# Patient Record
Sex: Male | Born: 1995 | Hispanic: Yes | Marital: Single | Smoking: Never smoker
Health system: Southern US, Community
[De-identification: ages and names within clinical notes are randomized; demographics above are authoritative.]

## PROBLEM LIST (undated history)

## (undated) HISTORY — PX: NO PAST SURGERIES: SHX2092

---
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
## 2019-03-19 ENCOUNTER — Other Ambulatory Visit: Payer: Self-pay

## 2019-03-19 ENCOUNTER — Ambulatory Visit
Admission: EM | Admit: 2019-03-19 | Discharge: 2019-03-19 | Disposition: A | Discharge status: Home / Self Care | Payer: Self-pay | Attending: Family Medicine | Admitting: Family Medicine

## 2019-03-19 DIAGNOSIS — H6692 Otitis media, unspecified, left ear: Secondary | ICD-10-CM

## 2019-03-19 MED ORDER — AMOXICILLIN 875 MG PO TABS: 875.0000 mg | ORAL_TABLET | Freq: Two times a day (BID) | ORAL | 0 refills | Status: DC

## 2019-03-19 NOTE — ED Triage Notes (Signed)
Patient complains of left ear pain that started 2 weeks ago. Patient states that he has been using OTC drops without relief. Patient states that he is concerned its now infected, reports that he needs a note for work.

## 2019-03-19 NOTE — ED Provider Notes (Signed)
[facility] ____________________________________________  Time seen: Approximately 11:36 AM  I have reviewed the triage vital signs and the nursing notes.   HISTORY  Chief Complaint Otalgia (left)   HPI Phillip Wang is a 23 y.o. male presenting for evaluation of left ear pain.  Patient reports of the last 2 weeks he has had gradual onset of left ear discomfort.  Has used over-the-counter eardrops which would relieve discomfort for approximately 30 minutes, but reports the pain has continued prompting him to come in.  Denies hearing changes, drainage, injury or other complaints.  No recent cough, congestion, sore throat or fevers.  Reports otherwise doing well.  Denies other aggravating alleviating factors.   History reviewed. No pertinent past medical history.  There are no active problems to display for this patient.   Past Surgical History:  Procedure Laterality Date  . NO PAST SURGERIES       No current facility-administered medications for this encounter.   Current Outpatient Medications:  .  amoxicillin (AMOXIL) 875 MG tablet, Take 1 tablet (875 mg total) by mouth 2 (two) times daily., Disp: 20 tablet, Rfl: 0  Allergies Patient has no known allergies.  No family history on file.  Social History Social History   Tobacco Use  . Smoking status: Never Smoker  . Smokeless tobacco: Never Used  Substance Use Topics  . Alcohol use: Yes    Comment: occasionally  . Drug use: Never    Review of Systems Constitutional: No fever ENT: No sore throat.  Positive ear pain. Cardiovascular: Denies chest pain. Respiratory: Denies shortness of breath. Gastrointestinal: No abdominal pain.   Skin: Negative for rash. Neurological: Negative for focal weakness or numbness.   ____________________________________________   PHYSICAL EXAM:  VITAL SIGNS: ED Triage Vitals  Enc Vitals Group     BP 03/19/19 1110 113/71     Pulse Rate 03/19/19 1110 (!) 59    Resp 03/19/19 1110 15     Temp 03/19/19 1110 98.4 F (36.9 C)     Temp Source 03/19/19 1110 Oral     SpO2 03/19/19 1110 100 %     Weight 03/19/19 1108 210 lb (95.3 kg)     Height 03/19/19 1108 5\' 8"  (1.727 m)     Head Circumference --      Peak Flow --      Pain Score 03/19/19 1108 8     Pain Loc --      Pain Edu? --      Excl. in GC? --    Constitutional: Alert and oriented. Well appearing and in no acute distress. Eyes: Conjunctivae are normal.  Head: Atraumatic. No sinus tenderness to palpation. No swelling. No erythema.  Ears: No mastoid tenderness bilaterally.  Left: Nontender, normal canal, moderate erythema and dull TM.  Right: Nontender, normal canal, no erythema, normal TM.  Nose:No nasal congestion. Neck: No stridor.  No cervical spine tenderness to palpation. Hematological/Lymphatic/Immunilogical: Mild left anterior auricular lymphadenopathy, no cervical lymphadenopathy. Cardiovascular: Normal rate, regular rhythm. Grossly normal heart sounds. Good peripheral circulation. Respiratory: Normal respiratory effort.  No retractions. No wheezes, rales or rhonchi. Good air movement.  Musculoskeletal: Ambulatory with steady gait.  Neurologic:  Normal speech and language. No gait instability. Skin:  Skin appears warm, dry and intact. No rash noted. Psychiatric: Mood and affect are normal. Speech and behavior are normal. ___________________________________________   LABS (all labs ordered are listed, but only abnormal results are displayed)  Labs Reviewed - No data to display ____________________________________________  PROCEDURES Procedures   INITIAL IMPRESSION / ASSESSMENT AND PLAN / ED COURSE  Pertinent labs & imaging results that were available during my care of the patient were reviewed by me and considered in my medical decision making (see chart for details).  Well-appearing patient.  No acute distress.  Left otalgia.  Left otitis media.  Will treat with  amoxicillin.  Over-the-counter ibuprofen, monitor.  Supportive care.  Work note given for today.Discussed indication, risks and benefits of medications with patient.  Discussed follow up and return parameters including no resolution or any worsening concerns. Patient verbalized understanding and agreed to plan.   ____________________________________________   FINAL CLINICAL IMPRESSION(S) / ED DIAGNOSES  Final diagnoses:  Left otitis media, unspecified otitis media type     ED Discharge Orders         Ordered    amoxicillin (AMOXIL) 875 MG tablet  2 times daily     03/19/19 1135           Note: This dictation was prepared with Dragon dictation along with smaller phrase technology. Any transcriptional errors that result from this process are unintentional.         Marylene Land, NP 03/19/19 1157

## 2019-03-19 NOTE — Discharge Instructions (Addendum)
Take medication as prescribed. Rest. Drink plenty of fluids.  Over-the-counter ibuprofen as needed.  Follow up with your primary care physician this week as needed. Return to Urgent care for new or worsening concerns.

## 2019-03-30 ENCOUNTER — Encounter: Payer: Self-pay | Admitting: Emergency Medicine

## 2019-03-30 ENCOUNTER — Ambulatory Visit
Admission: EM | Admit: 2019-03-30 | Discharge: 2019-03-30 | Disposition: A | Discharge status: Home / Self Care | Payer: Self-pay

## 2019-03-30 ENCOUNTER — Other Ambulatory Visit: Payer: Self-pay

## 2019-03-30 DIAGNOSIS — J329 Chronic sinusitis, unspecified: Secondary | ICD-10-CM

## 2019-03-30 DIAGNOSIS — J310 Chronic rhinitis: Secondary | ICD-10-CM

## 2019-03-30 MED ORDER — AZITHROMYCIN 250 MG PO TABS: ORAL_TABLET | ORAL | 0 refills | Status: AC

## 2019-03-30 NOTE — ED Provider Notes (Signed)
[place], [place]   Name: Phillip Wang DOB: [birth year] MRN: [id number] CSN: [id number] PCP: Patient, No Pcp Per  Arrival date and time:  03/30/19 3818  Chief Complaint:  Otalgia   NOTE: Prior to seeing the patient today, I have reviewed the triage nursing documentation and vital signs. Clinical staff has updated patient's PMH/PSHx, current medication list, and drug allergies/intolerances to ensure comprehensive history available to assist in medical decision making.   History:   HPI: Phillip Wang is a 23 y.o. male who presents today with complaints of pain in his LEFT ear. Patient was seen here on 03/19/2019 and diagnosed with otitis media. He was treated with a  10 day course of amoxicillin. Patient has completed the prescribed course of antibiotics, however he notes that this pain has not improved. Since completing the amoxicillin, patient notes that has has been experiencing generalized headaches and pain "around and behind" his LEFT eye. Patient denies fevers. He has had come mild congestion with no associated cough or shortness of breath. Patient denies otorrhea and sore throat. Has has not been forcefully blowing his nose. He denies changes to his ability to hear. In efforts to conservatively manage his symptoms at home, the patient notes that he has used APAP, which has helped to improve his symptoms to some degree.   History reviewed. No pertinent past medical history.  Past Surgical History:  Procedure Laterality Date  . NO PAST SURGERIES      History reviewed. No pertinent family history.  Social History   Tobacco Use  . Smoking status: Never Smoker  . Smokeless tobacco: Never Used  Substance Use Topics  . Alcohol use: Yes    Comment: occasionally  . Drug use: Never    There are no active problems to display for this patient.   Home Medications:    Current Meds  Medication Sig  . ibuprofen (ADVIL) 200 MG tablet Take 200 mg by mouth every 6 (six) hours as  needed.    Allergies:   Patient has no known allergies.  Review of Systems (ROS): Review of Systems  Constitutional: Negative for chills and fever.  HENT: Positive for congestion, ear pain, sinus pressure and sinus pain. Negative for ear discharge, tinnitus and trouble swallowing.   Eyes: Positive for pain. Negative for photophobia, discharge, redness, itching and visual disturbance.  Respiratory: Negative for cough and shortness of breath.   Cardiovascular: Negative for chest pain and palpitations.  Gastrointestinal: Negative for diarrhea, nausea and vomiting.  Musculoskeletal: Negative for back pain and myalgias.  Skin: Negative for color change, pallor and rash.  Neurological: Positive for headaches. Negative for dizziness, syncope, weakness and numbness.  All other systems reviewed and are negative.    Vital Signs: Today's Vitals   03/30/19 0954 03/30/19 0956 03/30/19 1029  BP:  118/75   Pulse:  70   Resp:  18   Temp:  98.2 F (36.8 C)   TempSrc:  Oral   SpO2:  100%   Weight: 210 lb (95.3 kg)    Height: 5\' 9"  (1.753 m)    PainSc: 8   8     Physical Exam: Physical Exam  Constitutional: He is oriented to person, place, and time and well-developed, well-nourished, and in no distress.  HENT:  Head: Normocephalic and atraumatic.  Right Ear: Hearing and tympanic membrane normal.  Left Ear: Hearing normal. No swelling. Tympanic membrane is injected (cloudy and injected;  not markedly erythematous or bulging). No decreased hearing is noted.  Nose: Mucosal edema present. No rhinorrhea. Left sinus exhibits frontal sinus tenderness.  Mouth/Throat: Uvula is midline and mucous membranes are normal. Posterior oropharyngeal erythema present. No posterior oropharyngeal edema.     Eyes: Pupils are equal, round, and reactive to light. EOM are normal.  Neck: Normal range of motion. Neck supple. No tracheal deviation present.  Cardiovascular: Normal rate, regular rhythm, normal heart  sounds and intact distal pulses. Exam reveals no gallop and no friction rub.  No murmur heard. Pulmonary/Chest: Effort normal and breath sounds normal. No respiratory distress. He has no wheezes. He has no rales.  Lymphadenopathy:       Head (left side): Submandibular adenopathy present.  Neurological: He is alert and oriented to person, place, and time. Gait normal.  Skin: Skin is warm and dry. No rash noted.  Psychiatric: Mood, memory, affect and judgment normal.  Nursing note and vitals reviewed.   Urgent Care Treatments / Results:   LABS: PLEASE NOTE: all labs that were ordered this encounter are listed, however only abnormal results are displayed. Labs Reviewed - No data to display  EKG: -None  RADIOLOGY: No results found.  PROCEDURES: Procedures  MEDICATIONS RECEIVED THIS VISIT: Medications - No data to display  PERTINENT CLINICAL COURSE NOTES/UPDATES:   Initial Impression / Assessment and Plan / Urgent Care Course:  Pertinent labs & imaging results that were available during my care of the patient were personally reviewed by me and considered in my medical decision making (see lab/imaging section of note for values and interpretations).  Terrill Mohrnrique J Sanches is a 23 y.o. male who [facility] with complaints of Otalgia   Patient is well appearing overall in clinic today. He does not appear to be in any acute distress. Presenting symptoms (see HPI) and exam as documented above. Patient has failed to improve with treatment using  amoxicillin. His symptoms have progressed and he is now having frontal sinus tenderness, generalized headaches, some congestion, and the continue pain in his LEFT ear. Suspect sinusitis.mBased on assessment notes from previous visit, his LEFT ear seems to have improved. In efforts to avoid another 10 day course of amoxicillin based therapy (Augmentin), will change to a macrolide antibiotic and treat with a 5 day course of  azithromycin. Discussed supportive care measures at home during acute phase of illness. Patient to rest as much as possible. He was encouraged to ensure adequate hydration (water and ORS) to prevent dehydration and electrolyte derangements. Patient may use APAP and/or IBU on an as needed basis for pain/fever. May use OTC decongestants as needed.   Discussed follow up with primary care physician in 1 week for re-evaluation. I have reviewed the follow up and strict return precautions for any new or worsening symptoms. Patient is aware of symptoms that would be deemed urgent/emergent, and would thus require further evaluation either here or in the emergency department. At the time of discharge, he verbalized understanding and consent with the discharge plan as it was reviewed with him. All questions were fielded by provider and/or clinic staff prior to patient discharge.    Final Clinical Impressions / Urgent Care Diagnoses:   Final diagnoses:  Rhinosinusitis    New Prescriptions:  [place] Controlled Substance Registry consulted? Not Applicable  Meds ordered this encounter  Medications  . azithromycin (ZITHROMAX Z-PAK) 250 MG tablet    Sig: Take 2 tablets (500 mg) for 1 day, then 1 tablet (250 mg) daily x 4 days.    Dispense:  6  each    Refill:  0    Recommended Follow up Care:  Patient encouraged to follow up with the following provider within the specified time frame, or sooner as dictated by the severity of his symptoms. As always, he was instructed that for any urgent/emergent care needs, he should seek care either here or in the emergency department for more immediate evaluation.  Follow-up Information    PCP In 1 week.   Why: General reassessment of symptoms if not improving        NOTE: This note was prepared using Scientist, clinical (histocompatibility and immunogenetics) along with smaller Lobbyist. Despite my best ability to proofread, there is the potential that transcriptional errors may still occur from  this process, and are completely unintentional.    Verlee Monte, NP 03/31/19 1643

## 2019-03-30 NOTE — ED Triage Notes (Signed)
Patient here today returning back from visit a wk ago. Stated that left ear pain has not gotten any better. Completed antibiotic and pain has not subsided.

## 2019-03-30 NOTE — Discharge Instructions (Addendum)
It was very nice seeing you today in clinic. Thank you for entrusting me with your care.   Rest and increase fluid intake. May use Tylenol and/or Ibuprofen as needed for pain/fever.  Please utilize the medications that we discussed. Your prescriptions have been called in to your pharmacy.   Make arrangements to follow up with your regular doctor in 1 week for re-evaluation if not improving. If your symptoms/condition worsens, please seek follow up care either here or in the ER. Please remember, our [facility] providers are "right here with you" when you need [place].   Again, it was my pleasure to take care of you today. Thank you for choosing our clinic. I hope that you start to feel better quickly.   Honor Loh, MSN, APRN, FNP-C, CEN Advanced Practice Provider [facility]

## 2019-04-02 ENCOUNTER — Emergency Department
Admission: EM | Admit: 2019-04-02 | Discharge: 2019-04-02 | Disposition: A | Discharge status: Home / Self Care | Payer: Self-pay | Attending: Student | Admitting: Student

## 2019-04-02 ENCOUNTER — Other Ambulatory Visit: Payer: Self-pay

## 2019-04-02 DIAGNOSIS — H6982 Other specified disorders of Eustachian tube, left ear: Secondary | ICD-10-CM | POA: Insufficient documentation

## 2019-04-02 MED ORDER — CETIRIZINE HCL 10 MG PO TABS: 10.0000 mg | ORAL_TABLET | Freq: Every day | ORAL | 0 refills | Status: AC

## 2019-04-02 MED ORDER — PREDNISONE 50 MG PO TABS: 50.0000 mg | ORAL_TABLET | Freq: Every day | ORAL | 0 refills | Status: AC

## 2019-04-02 MED ORDER — FLUTICASONE PROPIONATE 50 MCG/ACT NA SUSP: 1.0000 | Freq: Two times a day (BID) | NASAL | 0 refills | Status: AC

## 2019-04-02 NOTE — ED Triage Notes (Signed)
Pt reports left ear pain intermittently since September despite taking azithromycin and augmentin. No drainage from ear or fever.

## 2019-04-02 NOTE — ED Provider Notes (Signed)
[facility] Emergency Department Provider Note  ____________________________________________  Time seen: Approximately 8:28 PM  I have reviewed the triage vital signs and the nursing notes.   HISTORY  Chief Complaint Otalgia    HPI Phillip Wang is a 23 y.o. male who presents emergency department complaining of left ear pain.  Patient has had ongoing pain to the left ear times several weeks.  He has been seen twice at urgent care, first prescribed amoxicillin, then azithromycin for his left ear.  Patient was originally diagnosed with otitis media but states that the original antibiotic did not improve symptoms, nor did the second.  Patient reports at no time did the pain relief with antibiotic use.  He has had no fevers or chills, nasal congestion, sore throat or coughing.  No drainage from the left ear.  When asked about allergies patient denies any allergies but states that he typically has sinus pressure, ear pain with change of seasons.  Patient reports this typically occurs during the season.  No other medications other than amoxicillin and azithromycin have been attempted for symptom relief.         History reviewed. No pertinent past medical history.  There are no active problems to display for this patient.   Past Surgical History:  Procedure Laterality Date  . NO PAST SURGERIES      Prior to Admission medications   Medication Sig Start Date End Date Taking? Authorizing Provider  azithromycin (ZITHROMAX Z-PAK) 250 MG tablet Take 2 tablets (500 mg) for 1 day, then 1 tablet (250 mg) daily x 4 days. 03/30/19   Karen Kitchens, NP  cetirizine (ZYRTEC) 10 MG tablet Take 1 tablet (10 mg total) by mouth daily. 04/02/19   Cuthriell, Charline Bills, PA-C  fluticasone (FLONASE) 50 MCG/ACT nasal spray Place 1 spray into both nostrils 2 (two) times daily. 04/02/19   Cuthriell, Charline Bills, PA-C  ibuprofen (ADVIL) 200 MG tablet Take 200 mg by mouth every 6 (six)  hours as needed.    [provider]  predniSONE (DELTASONE) 50 MG tablet Take 1 tablet (50 mg total) by mouth daily with breakfast. 04/02/19   Cuthriell, Charline Bills, PA-C    Allergies Patient has no known allergies.  History reviewed. No pertinent family history.  Social History Social History   Tobacco Use  . Smoking status: Never Smoker  . Smokeless tobacco: Never Used  Substance Use Topics  . Alcohol use: Yes    Comment: occasionally  . Drug use: Never     Review of Systems  Constitutional: No fever/chills Eyes: No visual changes. No discharge ENT: Ongoing left ear pain Cardiovascular: no chest pain. Respiratory: no cough. No SOB. Gastrointestinal: No abdominal pain.  No nausea, no vomiting.  No diarrhea.  No constipation. Musculoskeletal: Negative for musculoskeletal pain. Skin: Negative for rash, abrasions, lacerations, ecchymosis. Neurological: Negative for headaches, focal weakness or numbness. 10-point ROS otherwise negative.  ____________________________________________   PHYSICAL EXAM:  VITAL SIGNS: ED Triage Vitals  Enc Vitals Group     BP 04/02/19 2014 133/71     Pulse Rate 04/02/19 2014 70     Resp 04/02/19 2014 16     Temp 04/02/19 2014 98.5 F (36.9 C)     Temp Source 04/02/19 2014 Oral     SpO2 04/02/19 2014 98 %     Weight 04/02/19 2014 210 lb (95.3 kg)     Height 04/02/19 2014 5\' 9"  (1.753 m)     Head Circumference --  Peak Flow --      Pain Score 04/02/19 2018 5     Pain Loc --      Pain Edu? --      Excl. in GC? --      Constitutional: Alert and oriented. Well appearing and in no acute distress. Eyes: Conjunctivae are normal. PERRL. EOMI. Head: Atraumatic. ENT:      Ears: EACs and TMs are visualized bilaterally.  EAC and TM on left is unremarkable.  TM is nonerythematous, but is slightly bulging on the left side.  Mild cloudy fluid is identified behind the TM, but again no injection or frank pus.  Patient is nontender to  palpation or percussion over the mastoids.  No tragal tenderness to palpation.      Nose: No congestion/rhinnorhea.      Mouth/Throat: Mucous membranes are moist.  Neck: No stridor.   Hematological/Lymphatic/Immunilogical: No cervical lymphadenopathy. Cardiovascular: Normal rate, regular rhythm. Normal S1 and S2.  Good peripheral circulation. Respiratory: Normal respiratory effort without tachypnea or retractions. Lungs CTAB. Good air entry to the bases with no decreased or absent breath sounds. Musculoskeletal: Full range of motion to all extremities. No gross deformities appreciated. Neurologic:  Normal speech and language. No gross focal neurologic deficits are appreciated.  Skin:  Skin is warm, dry and intact. No rash noted. Psychiatric: Mood and affect are normal. Speech and behavior are normal. Patient exhibits appropriate insight and judgement.   ____________________________________________   LABS (all labs ordered are listed, but only abnormal results are displayed)  Labs Reviewed - No data to display ____________________________________________  EKG   ____________________________________________  RADIOLOGY   No results found.  ____________________________________________    PROCEDURES  Procedure(s) performed:    Procedures    Medications - No data to display   ____________________________________________   INITIAL IMPRESSION / ASSESSMENT AND PLAN / ED COURSE  Pertinent labs & imaging results that were available during my care of the patient were reviewed by me and considered in my medical decision making (see chart for details).  Review of the  [place] was performed in accordance of the [facility] prior to dispensing any controlled drugs.           Patient's diagnosis is consistent with eustachian tube dysfunction.  Patient has had 2 to 3 weeks of ongoing left ear pain.  He has been treated with both amoxicillin as well as azithromycin with no  improvement of symptoms.  Patient has had no fevers or chills, nasal congestion, sore throat or cough.  On exam, patient has no injection of TM on the left side.  He does have bulging and mild cloudy fluid behind the TM.  No tenderness over the mastoids.  At this time, I have a low suspicion for ongoing otitis media.  I suspect that as this is typically seasonal in nature as described by the patient, this is happened multiple times in the past again at season change and antibiotics were unsuccessful that this is more likely eustachian tube dysfunction.  Patiently placed on Flonase and short course of steroid.  I have informed the patient that if symptoms improved with the medication no further follow-up is necessary.  If medications do not improve the symptoms he should follow-up with ENT.  If patient's symptoms worsen to include fevers or chills, hearing changes, pain in the mastoids or drainage from the ear he should return to the emergency department.. Patient is given ED precautions to return to the ED for any worsening or new  symptoms.     ____________________________________________  FINAL CLINICAL IMPRESSION(S) / ED DIAGNOSES  Final diagnoses:  Eustachian tube dysfunction, left      NEW MEDICATIONS STARTED DURING THIS VISIT:  ED Discharge Orders         Ordered    fluticasone (FLONASE) 50 MCG/ACT nasal spray  2 times daily     04/02/19 2046    cetirizine (ZYRTEC) 10 MG tablet  Daily     04/02/19 2046    predniSONE (DELTASONE) 50 MG tablet  Daily with breakfast     04/02/19 2046              This chart was dictated using voice recognition software/Dragon. Despite best efforts to proofread, errors can occur which can change the meaning. Any change was purely unintentional.    Racheal PatchesCuthriell, Jonathan D, PA-C 04/02/19 2047    Miguel AschoffMonks, Sarah L., MD 04/03/19 [phone]
